# Patient Record
Sex: Female | Born: 1970 | Race: White | Hispanic: No | Marital: Married | Smoking: Never smoker
Health system: Southern US, Community
[De-identification: ages and names within clinical notes are randomized; demographics above are authoritative.]

## PROBLEM LIST (undated history)

## (undated) DIAGNOSIS — T7840XA Allergy, unspecified, initial encounter: Secondary | ICD-10-CM

## (undated) DIAGNOSIS — K219 Gastro-esophageal reflux disease without esophagitis: Secondary | ICD-10-CM

## (undated) DIAGNOSIS — G609 Hereditary and idiopathic neuropathy, unspecified: Secondary | ICD-10-CM

## (undated) HISTORY — PX: MIDDLE EAR SURGERY: SHX713

## (undated) HISTORY — DX: Allergy, unspecified, initial encounter: T78.40XA

## (undated) HISTORY — DX: Hereditary and idiopathic neuropathy, unspecified: G60.9

## (undated) HISTORY — DX: Gastro-esophageal reflux disease without esophagitis: K21.9

## (undated) HISTORY — PX: BREAST BIOPSY: SHX20

---

## 2019-08-18 ENCOUNTER — Ambulatory Visit: Payer: Self-pay | Admitting: Neurology

## 2019-09-01 ENCOUNTER — Encounter: Payer: Self-pay | Admitting: *Deleted

## 2019-09-02 ENCOUNTER — Other Ambulatory Visit: Payer: Self-pay

## 2019-09-02 ENCOUNTER — Encounter: Payer: Self-pay | Admitting: Neurology

## 2019-09-02 ENCOUNTER — Ambulatory Visit (INDEPENDENT_AMBULATORY_CARE_PROVIDER_SITE_OTHER): Payer: BC Managed Care – PPO | Admitting: Neurology

## 2019-09-02 VITALS — BP 155/85 | HR 91 | Temp 98.0°F | Ht 62.0 in | Wt 200.0 lb

## 2019-09-02 DIAGNOSIS — R269 Unspecified abnormalities of gait and mobility: Secondary | ICD-10-CM | POA: Insufficient documentation

## 2019-09-02 DIAGNOSIS — R479 Unspecified speech disturbances: Secondary | ICD-10-CM | POA: Insufficient documentation

## 2019-09-02 DIAGNOSIS — R29898 Other symptoms and signs involving the musculoskeletal system: Secondary | ICD-10-CM | POA: Diagnosis not present

## 2019-09-02 DIAGNOSIS — R531 Weakness: Secondary | ICD-10-CM | POA: Insufficient documentation

## 2019-09-02 NOTE — Progress Notes (Signed)
PATIENT: Judy Matthews DOB: [birth year]  Chief Complaint  Patient presents with  . New Patient (Initial Visit)    pt alone, rm 12, about 4 yrs she developed (what they thought was) neuropathy and they attempted multiple testing and could never find anything wrong. states that she has had NCV/EMG's completed and xrays. she tried couple medications but they had horrible side effects. They worked up autoimmune VS neuro.  . Other    pt states that more recently in 1.5 mth she has become worried because things are happening where she cant write or type what she wants to say. States that her tongue goes numb and she finds herself  tripping when she walks.      HISTORICAL  Judy Matthews is a 49 year old female, seen in request by her primary care physician Dr. Caffie Damme for evaluation of constellation of complaints, initial evaluation is on September 02, 2019.  I have reviewed and summarized the referring note from the referring physician.  She recently moved from [facility] to [place], just established her care with primary care physician.  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  She was treated for her father some symptoms only, gabapentin initially was helpful, later she developed an tolerable side effect, excessive saliva, left facial numbness from gabapentin,  Since 2020, she also reported intermittent episode of difficulty talking, difficulty getting her words out, brain foggy sensation,  she gave me an example, that she was trying to write down J, but has difficult time to connected with word she was hearing and what she put down on the paper.  Those symptoms last about half day, denied persistent deficit.  She now works at home for [employer], denies excessive stress, sleeps well, but she complains of intermittent inside shaky sensation, also complains of intermittent gait abnormality,  Per patient, she is not an MRI candidate due to previous history of stapedectomy   REVIEW OF SYSTEMS: Full 14 system review of systems performed and notable only for as above All other review of systems were negative.  ALLERGIES: Allergies  Allergen Reactions  . Cinnamon   . Latex Rash  . Nickel Rash    HOME MEDICATIONS: Current Outpatient Medications  Medication Sig Dispense Refill  . OVER THE COUNTER MEDICATION CBD OIL 1500MG   1-2 DROPS DAILY     No current facility-administered medications for this visit.    PAST MEDICAL HISTORY: Past Medical History:  Diagnosis Date  . Acid reflux   . Allergies   . GERD (gastroesophageal reflux disease)   . Peripheral neuropathy, idiopathic     PAST SURGICAL HISTORY: Past Surgical History:  Procedure Laterality Date  . MIDDLE EAR SURGERY     Otosclerosis     FAMILY HISTORY: No family history on file.  SOCIAL HISTORY: Social History   Socioeconomic History  . Marital status: Married    Spouse name: Not on file  . Number of children: 4  . Years of education: Not on file  . Highest education level: Not on file  Occupational History  .  Not on file  Tobacco Use  . Smoking status: Never Smoker  . Smokeless tobacco: Never Used  Substance and Sexual Activity  . Alcohol use: Not Currently  . Drug use: Yes    Types: Marijuana    Comment: daily CBD/marijuana  . Sexual activity: Not on file  Other Topics Concern  . Not on file  Social History Narrative   Lives   Caffeine use:   Social Determinants of Health    Financial Resource Strain:   . Difficulty of Paying Living Expenses:   Food Insecurity:   . Worried About Programme researcher, broadcasting/film/video in the Last Year:   . Barista in the Last Year:   Transportation Needs:   . Freight forwarder (Medical):   Marland Kitchen Lack of Transportation (Non-Medical):   Physical Activity:   . Days of Exercise per Week:   . Minutes of Exercise per Session:   Stress:   . Feeling of Stress :   Social Connections:   . Frequency of Communication with Friends and Family:   . Frequency of Social Gatherings with Friends and Family:   . Attends Religious Services:   . Active Member of Clubs or Organizations:   . Attends Banker Meetings:   Marland Kitchen Marital Status:   Intimate Partner Violence:   . Fear of Current or Ex-Partner:   . Emotionally Abused:   Marland Kitchen Physically Abused:   . Sexually Abused:      PHYSICAL EXAM   Vitals:   09/02/19 0847  BP: (!) 155/85  Pulse: 91  Temp: 98 F (36.7 C)  Weight: 200 lb (90.7 kg)  Height: 5\' 2"  (1.575 m)    Not recorded      Body mass index is 36.58 kg/m.  PHYSICAL EXAMNIATION:  Gen: NAD, conversant, well nourised, well groomed                     Cardiovascular: Regular rate rhythm, no peripheral edema, warm, nontender. Eyes: Conjunctivae clear without exudates or hemorrhage Neck: Supple, no carotid bruits. Pulmonary: Clear to auscultation bilaterally   NEUROLOGICAL EXAM:  MENTAL STATUS: Speech:    Speech is normal; fluent and spontaneous with normal comprehension.  Cognition:     Orientation to time, place and person     Normal recent and remote memory     Normal Attention span and concentration     Normal Language, naming, repeating,spontaneous speech     Fund of knowledge   CRANIAL NERVES: CN II: Visual fields are full to confrontation. Pupils are round equal and briskly reactive to light. CN III, IV, VI: extraocular movement are normal. No ptosis. CN V: Facial sensation is intact to light  touch CN VII: Face is symmetric with normal eye closure  CN VIII: Hearing is normal to causal conversation. CN IX, X: Phonation is normal. CN XI: Head turning and shoulder shrug are intact  MOTOR: There is no pronator drift of out-stretched arms. Muscle bulk and tone are normal. Muscle strength is normal.  REFLEXES: Reflexes are 2+ and symmetric at the biceps, triceps, knees, and ankles. Plantar responses are flexor.  SENSORY: Intact to light touch, pinprick and vibratory sensation are intact in fingers and toes.  COORDINATION: There is no trunk or limb dysmetria noted.  GAIT/STANCE: Posture is normal. Gait is steady with normal steps, base, arm swing, and turning. Heel and toe walking are normal. Tandem gait is normal.  Romberg is absent.   DIAGNOSTIC DATA (LABS,  IMAGING, TESTING) - I reviewed patient records, labs, notes, testing and imaging myself where available.   ASSESSMENT AND PLAN  Judy Matthews is a 49 y.o. female   Constellation of complaints  Including intermittent brain foggy sensation, language difficulty, bilateral arm achy weakness,  Previously has tried gabapentin, recently Cymbalta without any benefit  " I just want to find out why"  Proceed with CT of the brain, and cervical spine, because history of stapedectomy, likely not MRI candidate  Get record from previous evaluation, and the laboratory evaluation primary care physician reported normal RPR, TSH, B04 folic acid  Return to clinic with Judy Matthews in 3 months   Judy Matthews, M.D. Ph.D.  [facility] [address], [place], [place] [address] Ph: [phone] Fax: [phone]  CC: Glendon Axe, MD

## 2019-09-05 ENCOUNTER — Telehealth: Payer: Self-pay | Admitting: Neurology

## 2019-09-05 NOTE — Telephone Encounter (Signed)
[employer] Auth: [id number] (exp. 09/03/19 to 11/01/19) order sent to [facility] they will reach out to the patient to schedule.

## 2019-09-23 ENCOUNTER — Ambulatory Visit
Admission: RE | Admit: 2019-09-23 | Discharge: 2019-09-23 | Disposition: A | Discharge status: Home / Self Care | Payer: BC Managed Care – PPO | Source: Ambulatory Visit | Attending: Neurology | Admitting: Neurology

## 2019-09-23 ENCOUNTER — Other Ambulatory Visit: Payer: Self-pay

## 2019-09-23 DIAGNOSIS — R479 Unspecified speech disturbances: Secondary | ICD-10-CM

## 2019-09-23 DIAGNOSIS — R29898 Other symptoms and signs involving the musculoskeletal system: Secondary | ICD-10-CM

## 2019-09-23 DIAGNOSIS — R269 Unspecified abnormalities of gait and mobility: Secondary | ICD-10-CM

## 2019-09-26 ENCOUNTER — Telehealth: Payer: Self-pay

## 2019-09-26 NOTE — Telephone Encounter (Signed)
Pt returned my call. I advised her that her CT neck and head were normal. I offered pt a sooner appt because she wanted to discuss the next steps but pt declined and decided to keep her July appt. Pt verbalized understanding of results. Pt had no questions at this time but was encouraged to call back if questions arise.

## 2019-09-26 NOTE — Telephone Encounter (Signed)
-----   Message from Levert Feinstein, MD sent at 09/26/2019  2:42 PM EDT ----- Please call pt for normal CT brain.

## 2019-09-26 NOTE — Telephone Encounter (Signed)
I called pt to discuss. No answer, left a message asking her to call me back. 

## 2019-09-26 NOTE — Telephone Encounter (Signed)
-----   Message from Levert Feinstein, MD sent at 09/26/2019  2:42 PM EDT ----- Please call pt for normal CT of cervical spine

## 2019-12-05 ENCOUNTER — Ambulatory Visit: Payer: BC Managed Care – PPO | Admitting: Neurology

## 2021-06-05 IMAGING — CT CT CERVICAL SPINE W/O CM
2 series · 10 of 14 positions shown, 12 images · non-contrast
Comparison: None.

CLINICAL DATA: Cervical radiculopathy. Hand neuropathy with
numbness in the lower extremities

EXAM:
CT CERVICAL SPINE WITHOUT CONTRAST
TECHNIQUE: Multidetector CT imaging of the cervical spine was performed without
intravenous contrast. Multiplanar CT image reconstructions were also
generated.

[Series 2: cspine soft · axial · 0.24mm/px · z∈[-210,-76]mm · 5 of 101 slices shown]
[im 17/101  soft-tissue]
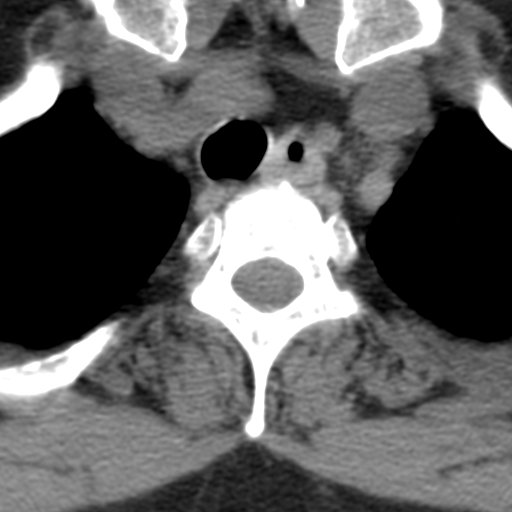
[im 34/101  soft-tissue]
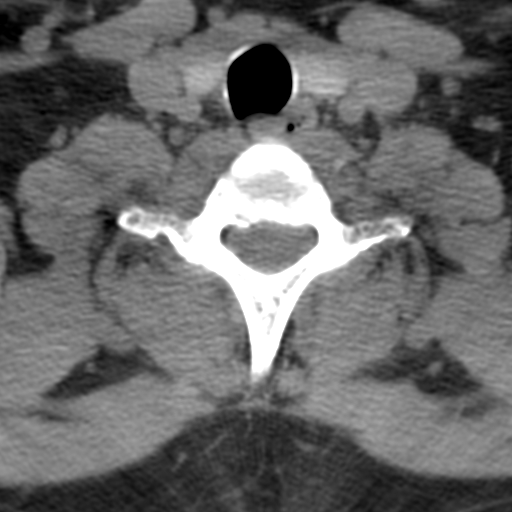
[im 51/101  soft-tissue]
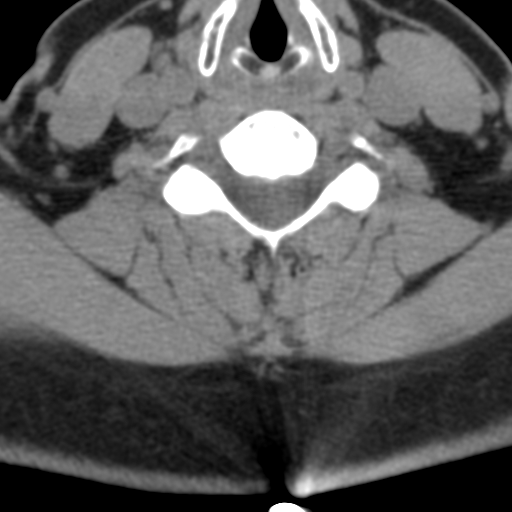
[im 67/101  soft-tissue]
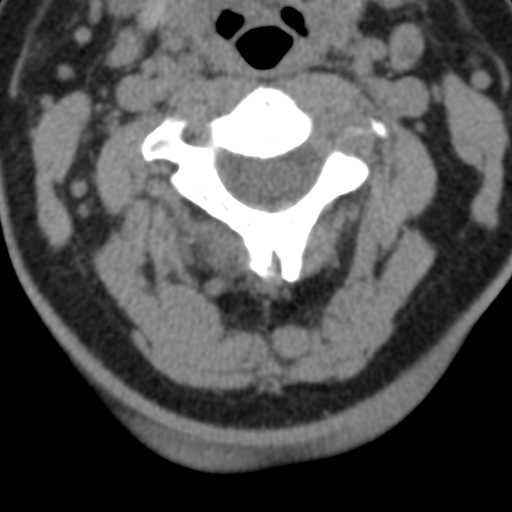
[im 84/101  soft-tissue]
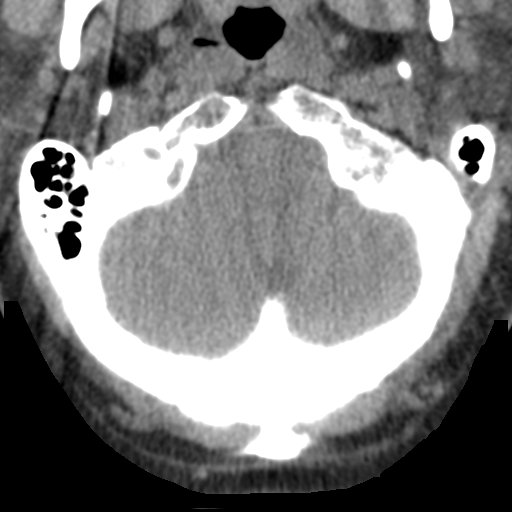

[Series 8: angled axial · axial · 0.29mm/px · z∈[-218,-88]mm · 5 of 100 slices shown, 7 images]
[im 17/100  soft-tissue]
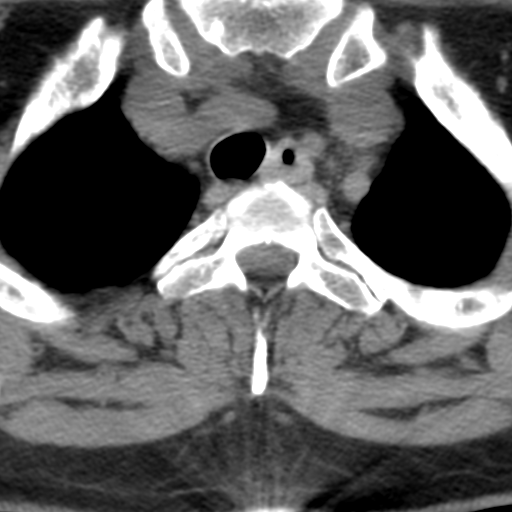
[im 17/100  bone]
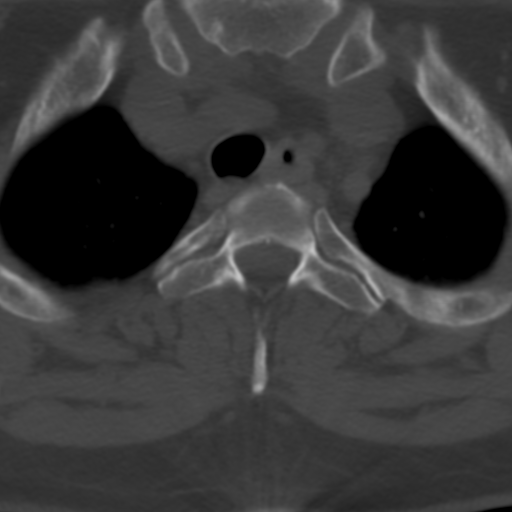
[im 34/100  bone]
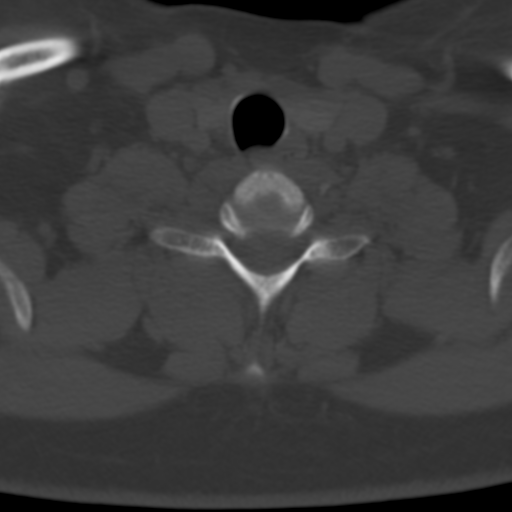
[im 50/100  bone]
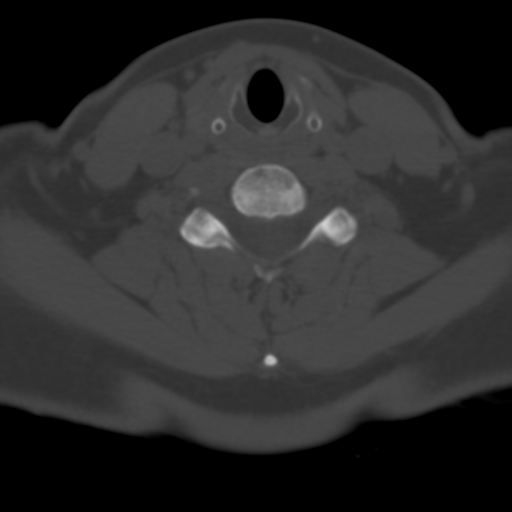
[im 67/100  bone]
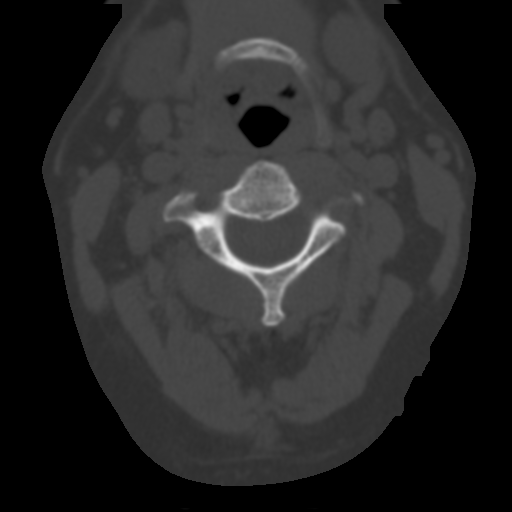
[im 83/100  soft-tissue]
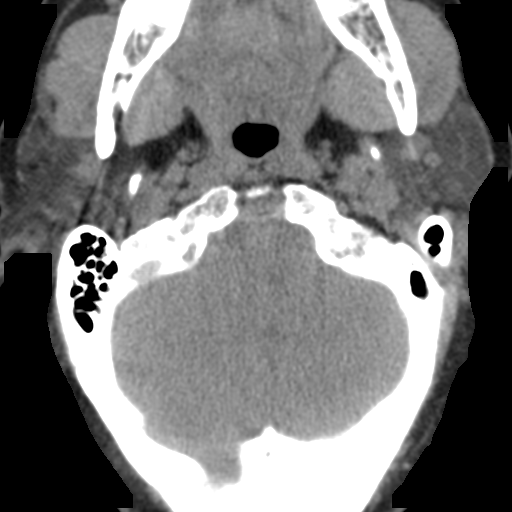
[im 83/100  bone]
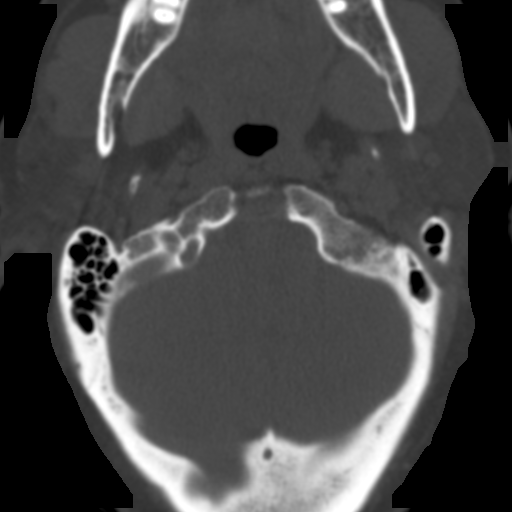

[10 of 14 positions shown; findings below may reference images not displayed]

FINDINGS: Alignment: Straightening of the cervical spine.

Skull base and vertebrae: No, fracture, erosion, or lesion

Soft tissues and spinal canal: Negative. No evidence of mass or
inflammation

Disc levels: Preserved disc height with no endplate spurring.
Negative facets and no impingement noted.

Upper chest: Negative
IMPRESSION: Negative study. No visible impingement to explain extremity
symptoms.

## 2021-07-15 ENCOUNTER — Ambulatory Visit (INDEPENDENT_AMBULATORY_CARE_PROVIDER_SITE_OTHER): Payer: 59 | Admitting: Neurology

## 2021-07-15 ENCOUNTER — Encounter: Payer: Self-pay | Admitting: Neurology

## 2021-07-15 VITALS — BP 143/87 | HR 93 | Ht 62.0 in | Wt 190.0 lb

## 2021-07-15 DIAGNOSIS — R269 Unspecified abnormalities of gait and mobility: Secondary | ICD-10-CM | POA: Diagnosis not present

## 2021-07-15 DIAGNOSIS — R29898 Other symptoms and signs involving the musculoskeletal system: Secondary | ICD-10-CM

## 2021-07-15 DIAGNOSIS — R4189 Other symptoms and signs involving cognitive functions and awareness: Secondary | ICD-10-CM | POA: Diagnosis not present

## 2021-07-15 NOTE — Progress Notes (Signed)
PATIENT: Judy Matthews DOB: 1971-04-29  Chief Complaint  Patient presents with   Follow-up    RM 12, alone c/o drooling,tongue numbness, Brain fog, hand and leg numbness, legs gives out on her , bladder incontinence, bad taste in month-       HISTORICAL  Judy Matthews is a 51 year old female, seen in request by her primary care physician Dr. Caffie Damme for evaluation of constellation of complaints, initial evaluation is on September 02, 2019.  I have reviewed and summarized the referring note from the referring physician.  She recently moved from [facility] to [place], just established her care with primary care physician.  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  She was treated for her father some symptoms only, gabapentin initially was helpful, later she developed an tolerable side effect, excessive saliva, left facial numbness from gabapentin,  Since 2020, she also reported intermittent episode of difficulty talking, difficulty getting her words out, brain foggy sensation, she gave me an example, that she was trying to write down J, but has difficult time to connected with word she was hearing and what she put down on the paper.  Those symptoms last about half day, denied persistent deficit.  She now works at home for [employer], denies excessive stress, sleeps well, but she complains of intermittent inside shaky sensation, also complains of intermittent gait  abnormality,  Per patient, she is not an MRI candidate due to previous history of stapedectomy with implant  UPDATE Jul 15 2021: Patient lost follow-up since initial visit in April 2021,  She continued complaints of constellation of complaints, including bad taste in her mouth, have to think to swallow, unsteady gait, likely not listening to her brain, fell occasionally, but the other times she can do more challenging physical activity without any difficulties  She dropped things frequently from her hands, 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    REVIEW OF SYSTEMS: Full 14 system review of systems performed and notable only for as above All other review of systems were negative.  ALLERGIES: Allergies  Allergen Reactions   Cinnamon    Latex Rash   Nickel Rash    HOME MEDICATIONS: Current Outpatient Medications  Medication Sig Dispense Refill   OVER THE COUNTER MEDICATION CBD OIL 1500MG   1-2 DROPS DAILY     No current facility-administered medications for this visit.    PAST MEDICAL HISTORY: Past Medical History:  Diagnosis Date   Acid reflux    Allergies    GERD (gastroesophageal reflux disease)    Peripheral neuropathy, idiopathic     PAST SURGICAL HISTORY: Past Surgical History:  Procedure Laterality Date   MIDDLE EAR SURGERY     Otosclerosis     FAMILY HISTORY: History reviewed. No pertinent family history.  SOCIAL HISTORY: Social History   Socioeconomic History   Marital status: Married  Spouse name: Not on file   Number of children: 4   Years of education: Not on file   Highest education level: Not on file  Occupational History   Not on file  Tobacco Use   Smoking status: Never   Smokeless tobacco: Never  Substance and Sexual Activity    Alcohol use: Not Currently   Drug use: Yes    Types: Marijuana    Comment: daily CBD/marijuana   Sexual activity: Not on file  Other Topics Concern   Not on file  Social History Narrative   Lives   Caffeine use:   Social Determinants of Health   Financial Resource Strain: Not on file  Food Insecurity: Not on file  Transportation Needs: Not on file  Physical Activity: Not on file  Stress: Not on file  Social Connections: Not on file  Intimate Partner Violence: Not on file     PHYSICAL EXAM   Vitals:   07/15/21 1003  BP: (!) 143/87  Pulse: 93  Weight: 190 lb (86.2 kg)  Height: 5\' 2"  (1.575 m)   Not recorded     Body mass index is 34.75 kg/m.  PHYSICAL EXAMNIATION:  Gen: NAD, conversant, well nourised, well groomed                     Cardiovascular: Regular rate rhythm, no peripheral edema, warm, nontender. Eyes: Conjunctivae clear without exudates or hemorrhage Neck: Supple, no carotid bruits. Pulmonary: Clear to auscultation bilaterally   NEUROLOGICAL EXAM:  MENTAL STATUS: Speech/cognition: Awake, alert, oriented to history taking and casual conversation   CRANIAL NERVES: CN II: Visual fields are full to confrontation. Pupils are round equal and briskly reactive to light. CN III, IV, VI: extraocular movement are normal. No ptosis. CN V: Facial sensation is intact to light touch CN VII: Face is symmetric with normal eye closure  CN VIII: Hearing is normal to causal conversation. CN IX, X: Phonation is normal. CN XI: Head turning and shoulder shrug are intact  MOTOR: There is no pronator drift of out-stretched arms. Muscle bulk and tone are normal. Muscle strength is normal.  REFLEXES: Reflexes are 2+ and symmetric at the biceps, triceps, knees, and ankles. Plantar responses are flexor.  SENSORY: Intact to light touch, pinprick and vibratory sensation are intact in fingers and toes.  COORDINATION: There is no trunk or limb dysmetria  noted.  GAIT/STANCE: Posture is normal. Gait is steady with normal steps, base, arm swing, and turning. Heel and toe walking are normal. Tandem gait is normal.  Romberg is absent.   DIAGNOSTIC DATA (LABS, IMAGING, TESTING) - I reviewed patient records, labs, notes, testing and imaging myself where available.   ASSESSMENT AND PLAN  Judy Matthews is a 51 y.o. female   Constellation of complaints  Including intermittent brain foggy sensation, language difficulty, bilateral arm achy weakness, tongue numbness, blurry vision, cognitive issues, loss of taste,  Essentially normal neurological examinations,  Normal CT head and brain, not MRI candidate due to history of stapedectomy with implant  Patient is apparently very frustrated by her symptoms, want to find out why, willing to proceed with more test,  Ordered EEG laboratory evaluation to rule out hemisphere malfunction  Hard to localize the lesion because of fleeting symptoms, also less suggestive of structural abnormality, if above evaluation showed no significant abnormality, she will continue follow-up with primary care physician,    43, M.D. Ph.D.  [facility] [address], [address] [place], [place]  [address] Ph: [phone] Fax: [phone]  CC: Caffie Damme, MD

## 2021-07-17 LAB — MULTIPLE MYELOMA PANEL, SERUM
Albumin SerPl Elph-Mcnc: 4.1 g/dL (ref 2.9–4.4)
Albumin/Glob SerPl: 1.4 (ref 0.7–1.7)
Alpha 1: 0.2 g/dL (ref 0.0–0.4)
Alpha2 Glob SerPl Elph-Mcnc: 0.6 g/dL (ref 0.4–1.0)
B-Globulin SerPl Elph-Mcnc: 1 g/dL (ref 0.7–1.3)
Gamma Glob SerPl Elph-Mcnc: 1.3 g/dL (ref 0.4–1.8)
Globulin, Total: 3.1 g/dL (ref 2.2–3.9)
IgA/Immunoglobulin A, Serum: 287 mg/dL (ref 87–352)
IgG (Immunoglobin G), Serum: 1178 mg/dL (ref 586–1602)
IgM (Immunoglobulin M), Srm: 105 mg/dL (ref 26–217)

## 2021-07-17 LAB — CBC WITH DIFFERENTIAL/PLATELET
Basophils Absolute: 0.1 10*3/uL (ref 0.0–0.2)
Basos: 1 %
EOS (ABSOLUTE): 0.1 10*3/uL (ref 0.0–0.4)
Eos: 1 %
Hematocrit: 42.6 % (ref 34.0–46.6)
Hemoglobin: 14.8 g/dL (ref 11.1–15.9)
Immature Grans (Abs): 0 10*3/uL (ref 0.0–0.1)
Immature Granulocytes: 0 %
Lymphocytes Absolute: 2.1 10*3/uL (ref 0.7–3.1)
Lymphs: 26 %
MCH: 31.7 pg (ref 26.6–33.0)
MCHC: 34.7 g/dL (ref 31.5–35.7)
MCV: 91 fL (ref 79–97)
Monocytes Absolute: 0.6 10*3/uL (ref 0.1–0.9)
Monocytes: 7 %
Neutrophils Absolute: 5.2 10*3/uL (ref 1.4–7.0)
Neutrophils: 65 %
Platelets: 285 10*3/uL (ref 150–450)
RBC: 4.67 x10E6/uL (ref 3.77–5.28)
RDW: 13.8 % (ref 11.7–15.4)
WBC: 8 10*3/uL (ref 3.4–10.8)

## 2021-07-17 LAB — COMPREHENSIVE METABOLIC PANEL
ALT: 20 IU/L (ref 0–32)
AST: 17 IU/L (ref 0–40)
Albumin/Globulin Ratio: 1.8 (ref 1.2–2.2)
Albumin: 4.6 g/dL (ref 3.8–4.9)
Alkaline Phosphatase: 65 IU/L (ref 44–121)
BUN/Creatinine Ratio: 15 (ref 9–23)
BUN: 11 mg/dL (ref 6–24)
Bilirubin Total: 0.3 mg/dL (ref 0.0–1.2)
CO2: 22 mmol/L (ref 20–29)
Calcium: 9.1 mg/dL (ref 8.7–10.2)
Chloride: 103 mmol/L (ref 96–106)
Creatinine, Ser: 0.71 mg/dL (ref 0.57–1.00)
Globulin, Total: 2.6 g/dL (ref 1.5–4.5)
Glucose: 92 mg/dL (ref 70–99)
Potassium: 4.4 mmol/L (ref 3.5–5.2)
Sodium: 138 mmol/L (ref 134–144)
Total Protein: 7.2 g/dL (ref 6.0–8.5)
eGFR: 103 mL/min/{1.73_m2} (ref >59)

## 2021-07-17 LAB — C-REACTIVE PROTEIN: CRP: <1 mg/L (ref 0–10)

## 2021-07-17 LAB — SEDIMENTATION RATE: Sed Rate: 2 mm/hr (ref 0–40)

## 2021-07-17 LAB — RPR: RPR Ser Ql: NONREACTIVE

## 2021-07-17 LAB — VITAMIN B12: Vitamin B-12: 524 pg/mL (ref 232–1245)

## 2021-07-17 LAB — CK: Total CK: 59 U/L (ref 32–182)

## 2021-07-17 LAB — FOLATE: Folate: 8 ng/mL (ref >3.0)

## 2021-07-17 LAB — TSH: TSH: 1.77 u[IU]/mL (ref 0.450–4.500)

## 2021-07-17 LAB — HGB A1C W/O EAG: Hgb A1c MFr Bld: 5.3 % (ref 4.8–5.6)

## 2021-07-17 LAB — VITAMIN D 25 HYDROXY (VIT D DEFICIENCY, FRACTURES): Vit D, 25-Hydroxy: 18.6 ng/mL — ABNORMAL LOW (ref 30.0–100.0)

## 2021-07-17 LAB — ANA W/REFLEX IF POSITIVE: Anti Nuclear Antibody (ANA): NEGATIVE

## 2021-07-22 ENCOUNTER — Ambulatory Visit (INDEPENDENT_AMBULATORY_CARE_PROVIDER_SITE_OTHER): Payer: 59 | Admitting: Neurology

## 2021-07-22 DIAGNOSIS — R269 Unspecified abnormalities of gait and mobility: Secondary | ICD-10-CM

## 2021-07-22 DIAGNOSIS — R4182 Altered mental status, unspecified: Secondary | ICD-10-CM

## 2021-07-22 DIAGNOSIS — R4189 Other symptoms and signs involving cognitive functions and awareness: Secondary | ICD-10-CM

## 2021-07-22 DIAGNOSIS — R29898 Other symptoms and signs involving the musculoskeletal system: Secondary | ICD-10-CM

## 2021-07-24 NOTE — Procedures (Signed)
? ?  HISTORY: 51 year old female, presented with intermittent numbness, brain fog ? ?TECHNIQUE:  ?This is a routine 16 channel EEG recording with one channel devoted to a limited EKG recording.  It was performed during wakefulness, drowsiness and asleep.  Hyperventilation and photic stimulation were performed as activating procedures.  There are minimum muscle and movement artifact noted. ? ?Upon maximum arousal, posterior dominant waking rhythm consistent of rhythmic alpha range activity, with frequency of 10 hz. Activities are symmetric over the bilateral posterior derivations and attenuated with eye opening. ? ?Hyperventilation produced mild/moderate buildup with higher amplitude and the slower activities noted. ? ?Photic stimulation did not alter the tracing. ? ?During EEG recording, patient developed drowsiness and no deeper stage of sleep was achieved ? ?During EEG recording, there was no epileptiform discharge noted. ? ?EKG demonstrate sinus rhythm, with heart rate of 64 bpm ? ?CONCLUSION: ?This is a  normal awake EEG.  There is no electrodiagnostic evidence of epileptiform discharge. ? ?Levert Feinstein, M.D. Ph.D. ? ?[facility] ?[address] ?[place], [place] [address] ?Phone: [phone] ?Fax:      [phone]  ?

## 2023-08-17 ENCOUNTER — Telehealth: Payer: Self-pay | Admitting: Neurology

## 2023-08-17 NOTE — Telephone Encounter (Signed)
 Pt said Friday starting experiencing numbness in arms, legs, tongue. TakeTart Cherry, B12, D3. Have been treating the symptoms due to do not want to go on Gabapentin. Would like a call from the nurse

## 2023-08-17 NOTE — Telephone Encounter (Signed)
 OK to my next available

## 2023-08-17 NOTE — Telephone Encounter (Signed)
 Returned call to pt and was able to get them scheduled on 08/27/23 at 3:30pm as advised by April j rn

## 2023-08-17 NOTE — Telephone Encounter (Signed)
 Call to patient and she reports she just saw her PCP and the symptoms are the same with the numbness tongue both arms and both legs and twitching arms and legs that are now last for days rather than hours. She states Dr. Terrace Arabia had mention biopsy at one point and wants to know if that is still an option. Advised I would send to Dr, Terrace Arabia for review.

## 2023-08-27 ENCOUNTER — Encounter: Payer: Self-pay | Admitting: Neurology

## 2023-08-27 ENCOUNTER — Ambulatory Visit: Admitting: Neurology

## 2023-08-27 VITALS — BP 134/82 | HR 90 | Ht 62.0 in | Wt 199.0 lb

## 2023-08-27 DIAGNOSIS — R202 Paresthesia of skin: Secondary | ICD-10-CM

## 2023-08-27 DIAGNOSIS — R531 Weakness: Secondary | ICD-10-CM

## 2023-08-27 NOTE — Progress Notes (Addendum)
 Chief Complaint  Patient presents with   Follow-up    Pt In  14, here with mother Cherylynn Cosier  Pt is here for numbness arms/legs and left side of the face and lips. States it has been going on for the past 2 weeks.       ASSESSMENT AND PLAN  Judy Matthews is a 53 y.o. female   Constellation of complaints,  Essentially normal neurological examination,  Not a candidate for MRI due to previous history of stapedectomy with implant  CT head without contrast  She is very frustrated about her symptoms, will refer to academic center  Complains of difficulty typing, referred to occupational therapy  DIAGNOSTIC DATA (LABS, IMAGING, TESTING) - I reviewed patient records, labs, notes, testing and imaging myself where available.  Laboratory evaluations from April 2025, hemoglobin of 13.9, negative rheumatoid factor, RPR, TSH 1.93, CPK of 229 A1c of 5.0, T 3 was within normal limit 11.43, free T41.20 MEDICAL HISTORY:  Judy Matthews, is a 53 year old female, accompanied by her mother seen in request by Dr. Bertrum Brodie, for evaluation of numbness of her arms and legs, left side of her face,   History is obtained from the patient and review of electronic medical records. I personally reviewed pertinent available imaging films in PACS.   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  PHYSICAL EXAMNIATION:  Gen: NAD, conversant, well nourised, well groomed                     Cardiovascular: Regular rate rhythm, no peripheral edema, warm, nontender. Eyes: Conjunctivae clear without exudates or hemorrhage Neck: Supple, no carotid bruits. Pulmonary: Clear to auscultation bilaterally   NEUROLOGICAL EXAM:  MENTAL STATUS: Speech/cognition: Awake, alert, oriented to history taking and casual conversation CRANIAL NERVES: CN II: Visual fields are full to confrontation. Pupils are round equal and briskly reactive to light. CN III, IV, VI: extraocular movement are normal. No ptosis. CN V: Facial sensation is intact to light touch CN VII: Face is symmetric with normal eye closure  CN VIII: Hearing is normal to causal conversation. CN IX, X: Phonation is normal. CN XI: Head turning and shoulder shrug are  intact  MOTOR: There is no pronator drift of out-stretched arms. Muscle bulk and tone are normal. Muscle strength is normal.  REFLEXES: Reflexes are 2+ and symmetric at the biceps, triceps, knees, and ankles. Plantar responses are flexor.  SENSORY: Intact to light touch, pinprick and vibratory sensation are intact in fingers and  toes.  COORDINATION: There is no trunk or limb dysmetria noted.  GAIT/STANCE: Posture is normal. Gait is steady with normal steps, base, arm swing, and turning. Heel and toe walking are normal. Tandem gait is normal.  Romberg is absent.  REVIEW OF SYSTEMS:  Full 14 system review of systems performed and notable only for as above All other review of systems were negative.   ALLERGIES: Allergies  Allergen Reactions   Cinnamon    Latex Rash   Nickel Rash    HOME MEDICATIONS: Current Outpatient Medications  Medication Sig Dispense Refill   cholecalciferol (VITAMIN D3) 25 MCG (1000 UNIT) tablet Take by mouth.     cyanocobalamin (VITAMIN B12) 1000 MCG tablet Take by mouth.     OVER THE COUNTER MEDICATION CBD OIL 1500MG   1-2 DROPS DAILY     No current facility-administered medications for this visit.    PAST MEDICAL HISTORY: Past Medical History:  Diagnosis Date   Acid reflux    Allergies    GERD (gastroesophageal reflux disease)    Peripheral neuropathy, idiopathic     PAST SURGICAL HISTORY: Past Surgical History:  Procedure Laterality Date   BREAST BIOPSY Left    MIDDLE EAR SURGERY     Otosclerosis     FAMILY HISTORY: No family history on file.  SOCIAL HISTORY: Social History   Socioeconomic History   Marital status: Married    Spouse name: Not on file   Number of children: 4   Years of education: Not on file   Highest education level: Not on file  Occupational History   Not on file  Tobacco Use   Smoking status: Never   Smokeless tobacco: Never  Substance and Sexual Activity   Alcohol use: Not Currently   Drug use: Yes    Types: Marijuana    Comment: daily CBD/marijuana   Sexual activity: Not on file  Other Topics Concern   Not on file  Social History Narrative   Lives   Caffeine use:   Social Drivers of Health   Financial Resource Strain: Not on file  Food Insecurity: Not on file  Transportation Needs: Not on file  Physical Activity: Not  on file  Stress: Not on file  Social Connections: Not on file  Intimate Partner Violence: Not on file      Phebe Brasil, M.D. Ph.D.  [facility] [address], [address] [place], [place] [address] Ph: [phone] Fax: [phone]  CC:  Bertrum Brodie, MD [address] [place],  [place] [address]  Bertrum Brodie, MD

## 2023-09-01 ENCOUNTER — Other Ambulatory Visit: Payer: Self-pay | Admitting: Neurology

## 2023-09-01 DIAGNOSIS — R202 Paresthesia of skin: Secondary | ICD-10-CM

## 2023-09-01 DIAGNOSIS — R531 Weakness: Secondary | ICD-10-CM

## 2023-09-01 DIAGNOSIS — G459 Transient cerebral ischemic attack, unspecified: Secondary | ICD-10-CM

## 2023-09-02 ENCOUNTER — Telehealth: Payer: Self-pay | Admitting: Neurology

## 2023-09-02 NOTE — Telephone Encounter (Signed)
 Referral for neurology fax to [facility]. Phone: [phone], Fax: [phone]

## 2023-09-02 NOTE — Telephone Encounter (Signed)
 sent to [facility] they obtain [place] [phone]

## 2023-09-07 ENCOUNTER — Ambulatory Visit
Admission: RE | Admit: 2023-09-07 | Discharge: 2023-09-07 | Disposition: A | Discharge status: Home / Self Care | Source: Ambulatory Visit | Attending: Neurology | Admitting: Neurology

## 2023-09-07 ENCOUNTER — Encounter: Payer: Self-pay | Admitting: Neurology

## 2023-09-07 DIAGNOSIS — R202 Paresthesia of skin: Secondary | ICD-10-CM

## 2023-09-08 ENCOUNTER — Ambulatory Visit: Attending: Neurology | Admitting: Occupational Therapy

## 2023-09-08 ENCOUNTER — Other Ambulatory Visit: Payer: Self-pay

## 2023-09-08 DIAGNOSIS — R531 Weakness: Secondary | ICD-10-CM | POA: Diagnosis not present

## 2023-09-08 DIAGNOSIS — R202 Paresthesia of skin: Secondary | ICD-10-CM | POA: Diagnosis present

## 2023-09-08 DIAGNOSIS — R278 Other lack of coordination: Secondary | ICD-10-CM | POA: Insufficient documentation

## 2023-09-08 DIAGNOSIS — M6281 Muscle weakness (generalized): Secondary | ICD-10-CM | POA: Insufficient documentation

## 2023-09-08 NOTE — Telephone Encounter (Signed)
 Pt has called for the results of her CT, pt was informed the RN just forwarded the information to Dr Gracie Lav on yesterday, and to allow Dr Gracie Lav time to result.

## 2023-09-08 NOTE — Therapy (Signed)
 OUTPATIENT OCCUPATIONAL THERAPY NEURO EVALUATION  Patient Name: Judy Matthews MRN: [id number] DOB:25-Nov-1970, 53 y.o., female Today's Date: 09/09/2023  PCP: Bertrum Brodie, MD REFERRING PROVIDER: Phebe Brasil, MD  END OF SESSION:  09/08/23 1533  OT Visits / Re-Eval  Visit Number 1  Number of Visits 13  Date for OT Re-Evaluation 10/23/23  Authorization  Authorization Type [employer]  OT Time Calculation  OT Start Time 1234  OT Stop Time 1318  OT Time Calculation (min) 44 min    Past Medical History:  Diagnosis Date   Acid reflux    Allergies    GERD (gastroesophageal reflux disease)    Peripheral neuropathy, idiopathic    Past Surgical History:  Procedure Laterality Date   BREAST BIOPSY Left    MIDDLE EAR SURGERY     Otosclerosis    Patient Active Problem List   Diagnosis Date Noted   Paresthesia 08/27/2023   Brain fog 07/15/2021   Speech disturbance 09/02/2019   Gait abnormality 09/02/2019   Weakness 09/02/2019    ONSET DATE: 08/27/23 referral date  REFERRING DIAG: R20.2 (ICD-10-CM) - Paresthesia R53.1 (ICD-10-CM) - Weakness  THERAPY DIAG:  Muscle weakness (generalized)  Other lack of coordination  Paresthesia of skin  Rationale for Evaluation and Treatment: Rehabilitation  SUBJECTIVE:   SUBJECTIVE STATEMENT: Pt reports reports neuropathy in hands starting 2017, started using gabapentin but did not like the side effects therefore was using CBD oil with good results.  Pt reports 08/14/23, pt could not even lift hand up on to desk and felt that arm and legs were both really heavy.  Pt reports scans and blood tests but no conclusive results at this time.  Pt reports difficulty with working due to decreased strength and endurance in hands, "heaviness" "feels like my muscles are weak and get fatigued easily".  Pt reports exhausted "by any little thing". 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  PRECAUTIONS: None  WEIGHT BEARING RESTRICTIONS: No  PAIN:  Are you having pain? No  FALLS: Has patient fallen in last 6 months? 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  HAND DOMINANCE: Right  ADLs: Overall ADLs: endurance and grasp/grip strength when shaving legs Transfers/ambulation related to ADLs: Independent LB Dressing: decreased lifting of LLE when donning pants, decreased  balance   IADLs: Light housekeeping: requires increased time for sweeping, mopping - requires increased rest breaks Meal Prep: difficulty opening water bottles,  Community mobility: reports arm fatigue after long drives  MOBILITY STATUS: Independent, reduced endurance  POSTURE COMMENTS:  No Significant postural limitations  ACTIVITY TOLERANCE: Activity tolerance: diminished  FUNCTIONAL OUTCOME MEASURES: 9 hole peg test: Right: 21.28 sec; Left: 23.78 sec Box and blocks: Right 55 blocks, Left 47 blocks  UPPER EXTREMITY ROM:    Passive ROM Right eval Left eval  Shoulder flexion [facility] WFL (reports having a calcification at RTC - mild decreased ROM compared to R)  Shoulder abduction    Shoulder adduction    Shoulder extension    Shoulder internal rotation    Shoulder external rotation    Elbow flexion    Elbow extension    Wrist flexion    Wrist extension    Wrist ulnar deviation    Wrist radial deviation    Wrist pronation    Wrist supination    (Blank rows = not tested)  UPPER EXTREMITY MMT:   Upon iniital assessment 4+/5 bilaterally, however assessed 2nd time for endurance, pt decreasing to 3+ bilaterally HAND FUNCTION: Grip strength: Right: 12,8,6 (average 8.6) lbs; Left: 12,11,9 (average 10.6) lbs and Lateral pinch: Right: 4 lbs, Left: 4 lbs  COORDINATION: 9 Hole Peg test: Right: 21.28 sec; Left: 23.78 sec Box and Blocks:  Right 55 blocks, Left 47 blocks - mild decrease in shoulder elevation and internal rotation as task went on  SENSATION: Not tested  COGNITION: Overall cognitive status: Within functional limits for tasks assessed  VISION: Subjective report: eyes fatigue quickly Baseline vision: Wears glasses for reading only  OBSERVATIONS: Pt demonstrating decent strength initially and good coordination, however demonstrating decreasing endurance with strength assessment and with prolonged tasks such as Box and Blocks.  Pt also with minimal guarding/bracing  in trunk and UE to attempt to compensate for decreased endurance.                                                                                                                             TREATMENT DATE:  09/08/23 Educated on role and purpose of OT as well as potential interventions and goals for therapy based on initial evaluation findings.   Encouraged pt to f/u with MD in regards to interpretation of scans and blood tests for further information and guidance as to root cause of concerns and if any more specific guidance for treatments. Pen tricks: educated on completion of pen tricks bilaterally with focus on coordination and endurance as pt with tendency to demonstrate decreased motor control as task continues.     PATIENT EDUCATION: Education details: Educated on role and purpose of OT as well as potential interventions and goals for therapy  based on initial evaluation findings. Person educated: Patient Education method: Explanation and Handouts Education comprehension: verbalized understanding  HOME EXERCISE PROGRAM: 09/08/23 - pen tricks (see pt instructions)   GOALS: Goals reviewed with patient? Yes  SHORT TERM GOALS: Target date: 10/02/23  Pt will be independent in gross and fine motor control HEP with use of visual handouts. Baseline: new to OPOT Goal status: INITIAL  2.  Pt will verbalize understanding of energy conservation strategies to increase safety and independence with ADLs and IADLs Baseline: diminished endurance. Goal status: INITIAL  3.  Pt will verbalize understanding of task modifications and/or potential A/E needs to increase ease, safety, and independence w/ ADLs. Baseline: diminished endurance. Goal status: INITIAL   LONG TERM GOALS: Target date: 10/23/23  Pt will demonstrate improved activity tolerance and coordination as evidenced by completing 5 mins of typing with <2 rest break or less than 10% errors. Baseline: TBD Goal status: INITIAL  2.   Pt will demonstrate improved grip strength to 30# or greater for functional grasp as needed for ADLs and IADLs. Baseline: Right: 12,8,6 (average 8.6) lbs; Left: 12,11,9 (average 10.6) lbs Goal status: INITIAL  3.  Pt will report or demonstrate improved activity tolerance with ability to complete 15 mins household or neighborhood walk with 1 or fewer rest breaks. Baseline: diminished endurance Goal status: INITIAL  4.  Pt will demonstrate improved UE functional use for ADLs as evidenced by increasing box/ blocks score by 5 blocks with LUE and maintaining with RUE. Baseline: Right 55 blocks, Left 47 blocks - mild decrease in shoulder elevation and internal rotation as task went on Goal status: INITIAL  5.  Pt will report improved functional use of BUE as evidenced by decreasing score on Quick DASH by 15%.  Baseline: TBD  Goal status: INITIAL   ASSESSMENT:  CLINICAL IMPRESSION: Patient is a 53 y.o. female who was seen today for occupational therapy evaluation for BUE weakness and decreased dexterity impacting ADLs and typing for work. 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 Pt's job requires a lot of typing and phone calls, pt reports fatigue in BUE with typing and even sustained reach and grasp. Pt will benefit from skilled occupational therapy services to address strength and coordination, pain management, altered sensation, GM/FM control, safety awareness, introduction of compensatory strategies/AE prn, and implementation of an HEP to improve participation and safety during ADLs and IADLs.     PERFORMANCE DEFICITS: in functional skills including ADLs, IADLs, coordination, dexterity, strength, pain, Fine motor control, Gross motor control, endurance, decreased knowledge of precautions, decreased knowledge of use of DME, and UE functional use and psychosocial skills including coping strategies, environmental adaptation, and routines and  behaviors.   IMPAIRMENTS: are limiting patient from ADLs, IADLs, rest and sleep, and work.   CO-MORBIDITIES: may have co-morbidities  that affects occupational performance. Patient will benefit from skilled OT to address above impairments and improve overall function.  MODIFICATION OR ASSISTANCE TO COMPLETE EVALUATION: No modification of tasks or assist necessary to complete an evaluation.  OT OCCUPATIONAL PROFILE AND HISTORY: Detailed assessment: Review of records and additional review of physical, cognitive, psychosocial history related to current functional performance.  CLINICAL DECISION MAKING: LOW - limited treatment options, no task modification necessary  REHAB POTENTIAL: Good  EVALUATION COMPLEXITY: Low    PLAN:  OT FREQUENCY: 1-2x/week  OT DURATION: 6 weeks  PLANNED INTERVENTIONS: 21308 OT Re-evaluation, 97535 self care/ADL training, 65784 therapeutic exercise,  97530 therapeutic activity, 97112 neuromuscular re-education, 97140 manual therapy, 97035 ultrasound, 08657 paraffin, functional mobility training, psychosocial skills training, energy conservation, coping strategies training, patient/family education, and DME and/or AE instructions  RECOMMENDED OTHER SERVICES: NA  CONSULTED AND AGREED WITH PLAN OF CARE: Patient  PLAN FOR NEXT SESSION: complete QuickDASH, assess typing for 5 mins if possible, initiate coordination and functional reaching   Bekka Qian, Isa Manuel, OTR/L 09/09/2023, 4:16 PM   [facility] [address], [address] [place], [place] [address] Phone # [phone] Fax # [phone]

## 2023-09-09 ENCOUNTER — Encounter: Payer: Self-pay | Admitting: Neurology

## 2023-09-09 NOTE — Patient Instructions (Signed)
In-Hand Manipulation Skills Rotation:  Hold pen, try to "twirl" like a baton, keeping flat with surface of table. Try going BOTH directions 10x For increased challenge, can complete with coin.  Flip:  Hold pen in writing position, flip in an arch to "erase" position, then back to "write" position. Do not lift hand off table.  10x   Translation:  Open hand palm up, put an object in your palm and then use your fingers and thumb to move it to the tips of your fingers, pinched against your thumb.  10x (Bigger is easier (fat marker), smaller is harder (penny))  Shift:  Hold pen like a dart, start "shifting" it forward until you are holding it at the base, then shift it backwards until you are holding it at the tip again. 10x (like putting a key in a key hole)  

## 2023-09-15 ENCOUNTER — Ambulatory Visit: Admitting: Occupational Therapy

## 2023-09-15 ENCOUNTER — Encounter: Payer: Self-pay | Admitting: Neurology

## 2023-09-17 ENCOUNTER — Ambulatory Visit: Admitting: Occupational Therapy

## 2023-09-21 NOTE — Telephone Encounter (Signed)
 Patient called in stating she is waiting for an answer from the mychart message that she sent last week and would like a message back.

## 2023-09-22 ENCOUNTER — Ambulatory Visit: Attending: Neurology | Admitting: Occupational Therapy

## 2023-09-22 DIAGNOSIS — R278 Other lack of coordination: Secondary | ICD-10-CM | POA: Diagnosis present

## 2023-09-22 DIAGNOSIS — R202 Paresthesia of skin: Secondary | ICD-10-CM | POA: Diagnosis present

## 2023-09-22 DIAGNOSIS — M6281 Muscle weakness (generalized): Secondary | ICD-10-CM | POA: Diagnosis present

## 2023-09-22 NOTE — Telephone Encounter (Signed)
 Pt called stating she has not heard from anyone in response to her my chart message she initially sent. The message from CMA on yesterday morning was relayed to her.  Pt stated thank you for the update

## 2023-09-22 NOTE — Therapy (Signed)
 OUTPATIENT OCCUPATIONAL THERAPY NEURO Treatment Note  Patient Name: Judy Matthews MRN: [id number] DOB:01/16/1971, 53 y.o., female Today's Date: 09/22/2023  PCP: Bertrum Brodie, MD REFERRING PROVIDER: Phebe Brasil, MD  END OF SESSION:  OT End of Session - 09/22/23 1403     Visit Number 2    Number of Visits 13    Date for OT Re-Evaluation 10/23/23    Authorization Type [employer]    OT Start Time 1230    OT Stop Time 1312    OT Time Calculation (min) 42 min             Past Medical History:  Diagnosis Date   Acid reflux    Allergies    GERD (gastroesophageal reflux disease)    Peripheral neuropathy, idiopathic    Past Surgical History:  Procedure Laterality Date   BREAST BIOPSY Left    MIDDLE EAR SURGERY     Otosclerosis    Patient Active Problem List   Diagnosis Date Noted   Paresthesia 08/27/2023   Brain fog 07/15/2021   Speech disturbance 09/02/2019   Gait abnormality 09/02/2019   Weakness 09/02/2019    ONSET DATE: 08/27/23 referral  REFERRING DIAG: R20.2 (ICD-10-CM) - Paresthesia R53.1 (ICD-10-CM) - Weakness   THERAPY DIAG:  Muscle weakness (generalized)  Other lack of coordination  Paresthesia of skin  Rationale for Evaluation and Treatment: Rehabilitation  SUBJECTIVE:   SUBJECTIVE STATEMENT: Pt reports no changes, reports fatigue and change in dexterity.  Pt reports frustration with lack of information.    Pt reports no pain, but that she does ache.   Pt reports fatiguing quickly with majority of household tasks.    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  PRECAUTIONS: None  WEIGHT BEARING RESTRICTIONS: No  PAIN:  Are you having pain? No  FALLS: Has patient fallen in last 6 months? 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  HAND DOMINANCE: Right  ADLs: Overall ADLs: endurance and grasp/grip strength when shaving legs Transfers/ambulation related to ADLs: Independent LB Dressing: decreased lifting of LLE when donning pants, decreased balance   IADLs: Light  housekeeping: requires increased time for sweeping, mopping - requires increased rest breaks Meal Prep: difficulty opening water bottles,  Community mobility: reports arm fatigue after long drives  MOBILITY STATUS: Independent, reduced endurance  POSTURE COMMENTS:  No Significant postural  limitations  ACTIVITY TOLERANCE: Activity tolerance: diminished  FUNCTIONAL OUTCOME MEASURES: 9 hole peg test: Right: 21.28 sec; Left: 23.78 sec Box and blocks: Right 55 blocks, Left 47 blocks    UPPER EXTREMITY ROM:    Passive ROM Right eval Left eval  Shoulder flexion [facility] WFL (reports having a calcification at RTC - mild decreased ROM compared to R)  Shoulder abduction    Shoulder adduction    Shoulder extension    Shoulder internal rotation    Shoulder external rotation    Elbow flexion    Elbow extension    Wrist flexion    Wrist extension    Wrist ulnar deviation    Wrist radial deviation    Wrist pronation    Wrist supination    (Blank rows = not tested)  UPPER EXTREMITY MMT:   Upon iniital assessment 4+/5 bilaterally, however assessed 2nd time for endurance, pt decreasing to 3+ bilaterally HAND FUNCTION: Grip strength: Right: 12,8,6 (average 8.6) lbs; Left: 12,11,9 (average 10.6) lbs and Lateral pinch: Right: 4 lbs, Left: 4 lbs  COORDINATION: 9 Hole Peg test: Right: 21.28 sec; Left: 23.78 sec Box and Blocks:  Right 55 blocks, Left 47 blocks - mild decrease in shoulder elevation and internal rotation as task went on  SENSATION: Not tested  COGNITION: Overall cognitive status: Within functional limits for tasks assessed  VISION: Subjective report: eyes fatigue quickly Baseline vision: Wears glasses for reading only  OBSERVATIONS: Pt demonstrating decent strength initially and good coordination, however demonstrating decreasing endurance with strength assessment and with prolonged tasks such as Box and Blocks.  Pt also with minimal guarding/bracing in trunk and UE to attempt to compensate for decreased endurance.                                                                                                                             TREATMENT DATE:  09/22/23 QuickDASH: see above for results of assessment.  Difficult to complete due to full body fatigue and  heaviness which increases with repetition, therefore responses vary greatly based on time of day or amount of effort required.  Pt also reports having an adaptive bottle opener and her spouse cutting her foods for her. Energy conservation: educated on 4P's of energy conservation (planning, prioritizing, pacing, and positioning) and providing cues and examples for each.  OT also providing with additional handout and education on adaptive techniques and positioning to aid in endurance during ADLs.  OT also educating on small activity throughout the day as able to continue to focus on activity tolerance. Pain management: OT educating on use of heat for pain, as well as alternative diet to aid in reducing pain.  Pt reports that she has removed tomatoes  largely from her diet to allow for decreased inflammation and curious about additional holistic ideas to aid in pain.  OT providing pt with resources for foods that may aid in reducing pain.    09/08/23 Educated on role and purpose of OT as well as potential interventions and goals for therapy based on initial evaluation findings.   Encouraged pt to f/u with MD in regards to interpretation of scans and blood tests for further information and guidance as to root cause of concerns and if any more specific guidance for treatments. Pen tricks: educated on completion of pen tricks bilaterally with focus on coordination and endurance as pt with tendency to demonstrate decreased motor control as task continues.     PATIENT EDUCATION: Education details: Energy conservation, pain management Person educated: Patient Education method: Explanation and Handouts Education comprehension: verbalized understanding  HOME EXERCISE PROGRAM: 09/08/23 - pen tricks (see pt instructions)   GOALS: Goals reviewed with patient? Yes  SHORT TERM GOALS: Target date: 10/02/23  Pt will be independent in gross and fine motor control HEP with use of visual handouts. Baseline:  new to OPOT Goal status: in progress  2.  Pt will verbalize understanding of energy conservation strategies to increase safety and independence with ADLs and IADLs Baseline: diminished endurance. Goal status:  in progress  3.  Pt will verbalize understanding of task modifications and/or potential A/E needs to increase ease, safety, and independence w/ ADLs. Baseline: diminished endurance. Goal status:  in progress   LONG TERM GOALS: Target date: 10/23/23  Pt will demonstrate improved activity tolerance and coordination as evidenced by completing 5 mins of typing with <2 rest break or less than 10% errors. Baseline: TBD Goal status:  in progress  2.  Pt will demonstrate improved grip strength to 30# or greater for functional grasp as needed for ADLs and IADLs. Baseline: Right: 12,8,6 (average 8.6) lbs; Left: 12,11,9 (average 10.6) lbs Goal status:  in progress  3.  Pt will report or demonstrate improved activity tolerance with ability to complete 15 mins household or neighborhood walk with 1 or fewer rest breaks. Baseline: diminished endurance Goal status:  in progress  4.  Pt will demonstrate improved UE functional use for ADLs as evidenced by increasing box/ blocks score by 5 blocks with LUE and maintaining with RUE. Baseline: Right 55 blocks, Left 47 blocks - mild decrease in shoulder elevation and internal rotation as task went on Goal status:  in progress  5.  Pt will report improved functional use of BUE as evidenced by decreasing score on Quick DASH by 15%.  Baseline: 61.4% on 09/22/23  Goal status:  in progress   ASSESSMENT:  CLINICAL IMPRESSION: Pt with reports of frustration of ongoing concerns and lack of information from medical team.  Pt reports no pain, but aching and heaviness intermittently - especially after many activities.  Pt will benefit from additional information from medical team regarding root cause of issues to direct treatment path with OT.  Pt planning to  f/u with PCP in addition to awaiting information and/or further testing from neurology. Pt continues to demonstrate decreased endurance, activity tolerance, and dexterity impacting ease, safety, and independence with participation and safety during ADLs and IADLs.     PERFORMANCE DEFICITS: in functional skills including ADLs, IADLs, coordination, dexterity, strength, pain, Fine motor control, Gross motor control, endurance, decreased knowledge of precautions, decreased knowledge of use of DME, and UE functional use and psychosocial skills including coping strategies, environmental adaptation, and routines and  behaviors.    PLAN:  OT FREQUENCY: 1-2x/week  OT DURATION: 6 weeks  PLANNED INTERVENTIONS: 97168 OT Re-evaluation, 97535 self care/ADL training, 54098 therapeutic exercise, 97530 therapeutic activity, 97112 neuromuscular re-education, 97140 manual therapy, 97035 ultrasound, 97018 paraffin, functional mobility training, psychosocial skills training, energy conservation, coping strategies training, patient/family education, and DME and/or AE instructions  RECOMMENDED OTHER SERVICES: NA  CONSULTED AND AGREED WITH PLAN OF CARE: Patient  PLAN FOR NEXT SESSION: assess typing for 5 mins if possible, initiate coordination and functional reaching  Energy conservation.  Anthonette Kinsman, OTR/L 09/22/2023, 2:03 PM  [facility] at [facility] [address] [place], [address] [place], [place] [address] Phone # [phone] Fax # [phone]

## 2023-09-22 NOTE — Therapy (Deleted)
 OUTPATIENT OCCUPATIONAL THERAPY NEURO EVALUATION  Patient Name: Lakiesha Rains MRN: [id number] DOB:Nov 13, 1970, 53 y.o., female Today's Date: 09/22/2023  PCP: Bertrum Brodie, MD REFERRING PROVIDER: Phebe Brasil, MD  END OF SESSION:  09/08/23 1533  OT Visits / Re-Eval  Visit Number 1  Number of Visits 13  Date for OT Re-Evaluation 10/23/23  Authorization  Authorization Type [employer]  OT Time Calculation  OT Start Time 1234  OT Stop Time 1318  OT Time Calculation (min) 44 min    Past Medical History:  Diagnosis Date   Acid reflux    Allergies    GERD (gastroesophageal reflux disease)    Peripheral neuropathy, idiopathic    Past Surgical History:  Procedure Laterality Date   BREAST BIOPSY Left    MIDDLE EAR SURGERY     Otosclerosis    Patient Active Problem List   Diagnosis Date Noted   Paresthesia 08/27/2023   Brain fog 07/15/2021   Speech disturbance 09/02/2019   Gait abnormality 09/02/2019   Weakness 09/02/2019    ONSET DATE: 08/27/23 referral date  REFERRING DIAG: R20.2 (ICD-10-CM) - Paresthesia R53.1 (ICD-10-CM) - Weakness  THERAPY DIAG:  No diagnosis found.  Rationale for Evaluation and Treatment: Rehabilitation  SUBJECTIVE:   SUBJECTIVE STATEMENT: Pt reports reports neuropathy in hands starting 2017, started using gabapentin but did not like the side effects therefore was using CBD oil with good results.  Pt reports 08/14/23, pt could not even lift hand up on to desk and felt that arm and legs were both really heavy.  Pt reports scans and blood tests but no conclusive results at this time.  Pt reports difficulty with working due to decreased strength and endurance in hands, "heaviness" "feels like my muscles are weak and get fatigued easily".  Pt reports exhausted "by any little thing". 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  PRECAUTIONS: None  WEIGHT BEARING RESTRICTIONS: No  PAIN:  Are you having pain? No  FALLS: Has patient fallen in last 6 months? 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  HAND DOMINANCE: Right  ADLs: Overall ADLs: endurance and  grasp/grip strength when shaving legs Transfers/ambulation related to ADLs: Independent LB Dressing: decreased lifting of LLE when donning pants, decreased balance   IADLs: Light housekeeping: requires increased time for  sweeping, mopping - requires increased rest breaks Meal Prep: difficulty opening water bottles,  Community mobility: reports arm fatigue after long drives  MOBILITY STATUS: Independent, reduced endurance  POSTURE COMMENTS:  No Significant postural limitations  ACTIVITY TOLERANCE: Activity tolerance: diminished  FUNCTIONAL OUTCOME MEASURES: 9 hole peg test: Right: 21.28 sec; Left: 23.78 sec Box and blocks: Right 55 blocks, Left 47 blocks  UPPER EXTREMITY ROM:    Passive ROM Right eval Left eval  Shoulder flexion [facility] WFL (reports having a calcification at RTC - mild decreased ROM compared to R)  Shoulder abduction    Shoulder adduction    Shoulder extension    Shoulder internal rotation    Shoulder external rotation    Elbow flexion    Elbow extension    Wrist flexion    Wrist extension    Wrist ulnar deviation    Wrist radial deviation    Wrist pronation    Wrist supination    (Blank rows = not tested)  UPPER EXTREMITY MMT:   Upon iniital assessment 4+/5 bilaterally, however assessed 2nd time for endurance, pt decreasing to 3+ bilaterally HAND FUNCTION: Grip strength: Right: 12,8,6 (average 8.6) lbs; Left: 12,11,9 (average 10.6) lbs and Lateral pinch: Right: 4 lbs, Left: 4 lbs  COORDINATION: 9 Hole Peg test: Right: 21.28 sec; Left: 23.78 sec Box and Blocks:  Right 55 blocks, Left 47 blocks - mild decrease in shoulder elevation and internal rotation as task went on  SENSATION: Not tested  COGNITION: Overall cognitive status: Within functional limits for tasks assessed  VISION: Subjective report: eyes fatigue quickly Baseline vision: Wears glasses for reading only  OBSERVATIONS: Pt demonstrating decent strength initially and good coordination, however demonstrating decreasing endurance with strength assessment and with prolonged tasks such as Box and Blocks.  Pt also with minimal guarding/bracing in trunk and UE to attempt to compensate for decreased  endurance.                                                                                                                             TREATMENT DATE:  09/08/23 Educated on role and purpose of OT as well as potential interventions and goals for therapy based on initial evaluation findings.   Encouraged pt to f/u with MD in regards to interpretation of scans and blood tests for further information and guidance as to root cause of concerns and if any more specific guidance for treatments. Pen tricks: educated on completion of pen tricks bilaterally with focus on coordination and endurance as pt with tendency to demonstrate decreased motor control as task continues.     PATIENT EDUCATION: Education details: Educated on role and purpose of OT as well as potential interventions and goals for therapy based on initial evaluation findings. Person educated: Patient Education  method: Explanation and Handouts Education comprehension: verbalized understanding  HOME EXERCISE PROGRAM: 09/08/23 - pen tricks (see pt instructions)   GOALS: Goals reviewed with patient? Yes  SHORT TERM GOALS: Target date: 10/02/23  Pt will be independent in gross and fine motor control HEP with use of visual handouts. Baseline: new to OPOT Goal status: INITIAL  2.  Pt will verbalize understanding of energy conservation strategies to increase safety and independence with ADLs and IADLs Baseline: diminished endurance. Goal status: INITIAL  3.  Pt will verbalize understanding of task modifications and/or potential A/E needs to increase ease, safety, and independence w/ ADLs. Baseline: diminished endurance. Goal status: INITIAL   LONG TERM GOALS: Target date: 10/23/23  Pt will demonstrate improved activity tolerance and coordination as evidenced by completing 5 mins of typing with <2 rest break or less than 10% errors. Baseline: TBD Goal status: INITIAL  2.  Pt will demonstrate improved grip strength to 30# or  greater for functional grasp as needed for ADLs and IADLs. Baseline: Right: 12,8,6 (average 8.6) lbs; Left: 12,11,9 (average 10.6) lbs Goal status: INITIAL  3.  Pt will report or demonstrate improved activity tolerance with ability to complete 15 mins household or neighborhood walk with 1 or fewer rest breaks. Baseline: diminished endurance Goal status: INITIAL  4.  Pt will demonstrate improved UE functional use for ADLs as evidenced by increasing box/ blocks score by 5 blocks with LUE and maintaining with RUE. Baseline: Right 55 blocks, Left 47 blocks - mild decrease in shoulder elevation and internal rotation as task went on Goal status: INITIAL  5.  Pt will report improved functional use of BUE as evidenced by decreasing score on Quick DASH by 15%.  Baseline: TBD  Goal status: INITIAL   ASSESSMENT:  CLINICAL IMPRESSION: Patient is a 53 y.o. female who was seen today for occupational therapy evaluation for BUE weakness and decreased dexterity impacting ADLs and typing for work. 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 Pt's job requires a lot of typing and phone calls, pt reports fatigue in BUE with typing and even sustained reach and grasp. Pt will benefit from skilled occupational therapy services to address strength and coordination, pain management, altered sensation, GM/FM control, safety awareness, introduction of compensatory strategies/AE prn, and implementation of an HEP to improve participation and safety during ADLs and IADLs.     PERFORMANCE DEFICITS: in functional skills including ADLs, IADLs, coordination, dexterity, strength, pain, Fine motor control, Gross motor control, endurance, decreased knowledge of precautions, decreased knowledge of use of DME, and UE functional use and psychosocial skills including coping strategies, environmental adaptation, and routines and behaviors.   IMPAIRMENTS: are limiting patient from  ADLs, IADLs, rest and sleep, and work.   CO-MORBIDITIES: may have co-morbidities  that affects occupational performance. Patient will benefit from skilled OT to address above impairments and improve overall function.  MODIFICATION OR ASSISTANCE TO COMPLETE EVALUATION: No modification of tasks or assist necessary to complete an evaluation.  OT OCCUPATIONAL PROFILE AND HISTORY: Detailed assessment: Review of records and additional review of physical, cognitive, psychosocial history related to current functional performance.  CLINICAL DECISION MAKING: LOW - limited treatment options, no task modification necessary  REHAB POTENTIAL: Good  EVALUATION COMPLEXITY: Low    PLAN:  OT FREQUENCY: 1-2x/week  OT DURATION: 6 weeks  PLANNED INTERVENTIONS: 97168 OT Re-evaluation, 97535 self care/ADL training, 16109 therapeutic exercise, 97530 therapeutic activity, 97112 neuromuscular re-education, 97140 manual therapy,  16109 ultrasound, 60454 paraffin, functional mobility training, psychosocial skills training, energy conservation, coping strategies training, patient/family education, and DME and/or AE instructions  RECOMMENDED OTHER SERVICES: NA  CONSULTED AND AGREED WITH PLAN OF CARE: Patient  PLAN FOR NEXT SESSION: complete QuickDASH, assess typing for 5 mins if possible, initiate coordination and functional reaching   Brooksie Ellwanger, Isa Manuel, OTR/L 09/22/2023, 8:02 AM   [facility] at [facility] [address] [place], [place] [address] Phone # [phone] Fax # [phone]

## 2023-09-23 ENCOUNTER — Encounter (INDEPENDENT_AMBULATORY_CARE_PROVIDER_SITE_OTHER): Payer: Self-pay | Admitting: Neurology

## 2023-09-23 DIAGNOSIS — R531 Weakness: Secondary | ICD-10-CM

## 2023-09-23 NOTE — Telephone Encounter (Signed)
 I called patient, she continued complaints of extreme fatigue, involving whole body, to the point of difficulty chewing and swallowing,  Extensive evaluation in February 2023 for constellation of complaints, failed to demonstrate etiology  Recent laboratory evaluation including CPK thyroid, rheumatoid factor was normal   Discussed with patient she agreed with more extensive evaluation, she has normal neurological examinations, repeat EMG nerve conduction study would be low yield, she even mentioned about nerve biopsy/muscle biopsy, I do not see indication for that, but we have agreed to have repeat laboratory evaluations  Orders Placed This Encounter  Procedures   Acetylcholine receptor, binding   VITAMIN D 25 Hydroxy (Vit-D Deficiency, Fractures)   Comprehensive metabolic panel with GFR   C-reactive protein   Vitamin B12   Sedimentation rate   Hgb A1c w/o eAG   ANA w/Reflex if Positive   Echocardiogram, ultrasound of carotid arteries pending,  She was also referred to [facility] neurologist for second opinion  Please see the MyChart message reply(ies) for my assessment and plan.    This patient gave consent for this Medical Advice Message and is aware that it may result in a bill to [employer], as well as the possibility of receiving a bill for a co-payment or deductible. They are an established patient, but are not seeking medical advice exclusively about a problem treated during an in person or video visit in the last seven days. I did not recommend an in person or video visit within seven days of my reply.    I spent a total of 15 minutes cumulative time within 7 days through [employer].  Phebe Brasil, MD

## 2023-09-24 ENCOUNTER — Ambulatory Visit: Admitting: Occupational Therapy

## 2023-09-24 ENCOUNTER — Other Ambulatory Visit (INDEPENDENT_AMBULATORY_CARE_PROVIDER_SITE_OTHER): Payer: Self-pay

## 2023-09-24 DIAGNOSIS — R531 Weakness: Secondary | ICD-10-CM

## 2023-09-24 DIAGNOSIS — Z0289 Encounter for other administrative examinations: Secondary | ICD-10-CM

## 2023-09-28 ENCOUNTER — Ambulatory Visit (HOSPITAL_BASED_OUTPATIENT_CLINIC_OR_DEPARTMENT_OTHER)
Admission: RE | Admit: 2023-09-28 | Discharge: 2023-09-28 | Disposition: A | Discharge status: Home / Self Care | Payer: Self-pay | Source: Ambulatory Visit | Attending: Neurology | Admitting: Neurology

## 2023-09-28 DIAGNOSIS — R531 Weakness: Secondary | ICD-10-CM | POA: Diagnosis not present

## 2023-09-28 DIAGNOSIS — G459 Transient cerebral ischemic attack, unspecified: Secondary | ICD-10-CM | POA: Insufficient documentation

## 2023-09-28 DIAGNOSIS — R202 Paresthesia of skin: Secondary | ICD-10-CM | POA: Diagnosis present

## 2023-09-28 LAB — COMPREHENSIVE METABOLIC PANEL WITH GFR
ALT: 16 IU/L (ref 0–32)
AST: 16 IU/L (ref 0–40)
Albumin: 4.4 g/dL (ref 3.8–4.9)
Alkaline Phosphatase: 69 IU/L (ref 44–121)
BUN/Creatinine Ratio: 18 (ref 9–23)
BUN: 13 mg/dL (ref 6–24)
Bilirubin Total: 0.4 mg/dL (ref 0.0–1.2)
CO2: 23 mmol/L (ref 20–29)
Calcium: 9.1 mg/dL (ref 8.7–10.2)
Chloride: 104 mmol/L (ref 96–106)
Creatinine, Ser: 0.73 mg/dL (ref 0.57–1.00)
Globulin, Total: 2.5 g/dL (ref 1.5–4.5)
Glucose: 81 mg/dL (ref 70–99)
Potassium: 4.4 mmol/L (ref 3.5–5.2)
Sodium: 140 mmol/L (ref 134–144)
Total Protein: 6.9 g/dL (ref 6.0–8.5)
eGFR: 98 mL/min/{1.73_m2} (ref >59)

## 2023-09-28 LAB — C-REACTIVE PROTEIN: CRP: <1 mg/L (ref 0–10)

## 2023-09-28 LAB — SEDIMENTATION RATE: Sed Rate: 2 mm/h (ref 0–40)

## 2023-09-28 LAB — VITAMIN B12: Vitamin B-12: 362 pg/mL (ref 232–1245)

## 2023-09-28 LAB — ACETYLCHOLINE RECEPTOR, BINDING

## 2023-09-28 LAB — VITAMIN D 25 HYDROXY (VIT D DEFICIENCY, FRACTURES): Vit D, 25-Hydroxy: 29.7 ng/mL — ABNORMAL LOW (ref 30.0–100.0)

## 2023-09-28 LAB — ANA W/REFLEX IF POSITIVE: Anti Nuclear Antibody (ANA): NEGATIVE

## 2023-09-28 LAB — HGB A1C W/O EAG: Hgb A1c MFr Bld: 5.5 % (ref 4.8–5.6)

## 2023-09-29 ENCOUNTER — Ambulatory Visit: Admitting: Occupational Therapy

## 2023-09-29 LAB — ECHOCARDIOGRAM COMPLETE
AR max vel: 1.76 cm2
AV Area VTI: 1.79 cm2
AV Area mean vel: 1.83 cm2
AV Mean grad: 4 mmHg
AV Peak grad: 7.5 mmHg
Ao pk vel: 1.37 m/s
Area-P 1/2: 3.89 cm2
Calc EF: 68.5 %
MV M vel: 4.84 m/s
MV Peak grad: 93.7 mmHg
Radius: 0.25 cm
S' Lateral: 3.1 cm
Single Plane A2C EF: 67.1 %
Single Plane A4C EF: 65.7 %

## 2023-09-30 ENCOUNTER — Encounter: Payer: Self-pay | Admitting: Neurology

## 2023-09-30 ENCOUNTER — Ambulatory Visit: Payer: Self-pay | Admitting: Neurology

## 2023-10-01 ENCOUNTER — Ambulatory Visit: Admitting: Occupational Therapy

## 2023-10-01 DIAGNOSIS — Z0289 Encounter for other administrative examinations: Secondary | ICD-10-CM

## 2023-10-01 NOTE — Telephone Encounter (Signed)
Dr. Krista Blue patient.

## 2023-10-05 ENCOUNTER — Telehealth: Payer: Self-pay

## 2023-10-05 NOTE — Telephone Encounter (Signed)
 Discussed FMLa paperwork with Dr. Gracie Lav. SH eis not agreement to complete. Patient was sent  for second opinion.

## 2023-10-06 ENCOUNTER — Encounter: Admitting: Occupational Therapy

## 2023-10-08 ENCOUNTER — Encounter: Admitting: Occupational Therapy

## 2023-10-13 ENCOUNTER — Encounter: Admitting: Occupational Therapy

## 2023-10-15 ENCOUNTER — Encounter: Admitting: Occupational Therapy

## 2023-10-20 ENCOUNTER — Encounter: Admitting: Occupational Therapy

## 2023-10-22 ENCOUNTER — Encounter: Admitting: Occupational Therapy
